# Patient Record
Sex: Male | Born: 1965 | Race: Black or African American | Hispanic: No | Marital: Married | Smoking: Never smoker
Health system: Southern US, Community
[De-identification: ages and names within clinical notes are randomized; demographics above are authoritative.]

## PROBLEM LIST (undated history)

## (undated) DIAGNOSIS — N200 Calculus of kidney: Secondary | ICD-10-CM

## (undated) DIAGNOSIS — I10 Essential (primary) hypertension: Secondary | ICD-10-CM

## (undated) DIAGNOSIS — R0789 Other chest pain: Secondary | ICD-10-CM

## (undated) HISTORY — PX: KIDNEY STONE SURGERY: SHX686

## (undated) HISTORY — DX: Essential (primary) hypertension: I10

## (undated) HISTORY — DX: Calculus of kidney: N20.0

## (undated) HISTORY — DX: Other chest pain: R07.89

---

## 2007-07-02 ENCOUNTER — Encounter: Admission: RE | Admit: 2007-07-02 | Discharge: 2007-07-02 | Payer: Self-pay | Admitting: Family Medicine

## 2008-09-20 ENCOUNTER — Encounter: Admission: RE | Admit: 2008-09-20 | Discharge: 2008-09-20 | Payer: Self-pay | Admitting: Family Medicine

## 2008-10-24 ENCOUNTER — Ambulatory Visit (HOSPITAL_COMMUNITY): Admission: RE | Admit: 2008-10-24 | Discharge: 2008-10-24 | Payer: Self-pay | Admitting: Otolaryngology

## 2008-10-24 ENCOUNTER — Encounter (INDEPENDENT_AMBULATORY_CARE_PROVIDER_SITE_OTHER): Payer: Self-pay | Admitting: Otolaryngology

## 2009-11-07 ENCOUNTER — Encounter: Admission: RE | Admit: 2009-11-07 | Discharge: 2009-11-07 | Payer: Self-pay | Admitting: Family Medicine

## 2010-04-17 ENCOUNTER — Ambulatory Visit: Payer: Self-pay | Admitting: Cardiology

## 2010-04-25 ENCOUNTER — Telehealth (INDEPENDENT_AMBULATORY_CARE_PROVIDER_SITE_OTHER): Payer: Self-pay | Admitting: Radiology

## 2010-04-26 ENCOUNTER — Encounter: Payer: Self-pay | Admitting: Cardiology

## 2010-04-26 ENCOUNTER — Ambulatory Visit: Payer: Self-pay

## 2010-04-26 ENCOUNTER — Encounter (HOSPITAL_COMMUNITY)
Admission: RE | Admit: 2010-04-26 | Discharge: 2010-05-11 | Payer: Self-pay | Source: Home / Self Care | Attending: Cardiology | Admitting: Cardiology

## 2010-04-26 ENCOUNTER — Ambulatory Visit: Payer: Self-pay | Admitting: Cardiology

## 2010-06-20 ENCOUNTER — Ambulatory Visit: Payer: Self-pay | Admitting: Cardiology

## 2010-08-14 NOTE — Assessment & Plan Note (Signed)
Summary: Cardiology Nuclear Testing  Nuclear Med Background Indications for Stress Test: Evaluation for Ischemia   History: Echo  History Comments: '08 Stress Echo:normal  Symptoms: Chest Tightness, Fatigue  Symptoms Comments: Last episode of CP:This a.m., 3/10.   Nuclear Pre-Procedure Cardiac Risk Factors: Family History - CAD, Hypertension, Obesity Caffeine/Decaff Intake: None NPO After: 1:00 AM Lungs: Clear.  O2 Sat 97%. IV 0.9% NS with Angio Cath: 22g     IV Site: R Hand IV Started by: Bonnita Levan, RN Chest Size (in) 46     Height (in): 70 Weight (lb): 235 BMI: 33.84 Tech Comments: All meds. taken today.  Nuclear Med Study 1 or 2 day study:  1 day     Stress Test Type:  Eugenie Birks Reading MD:  Olga Millers, MD     Referring MD:  Peter Swazil[place], MD Resting Radionuclide:  Technetium 1m Tetrofosmin     Resting Radionuclide Dose:  11 mCi  Stress Radionuclide:  Technetium 26m Tetrofosmin     Stress Radionuclide Dose:  33 mCi   Stress Protocol   Lexiscan: 0.4 mg   Stress Test Technologist:  [employer]a [employer], CMA-N     Nuclear Technologist:  Doyne Keel, CNMT  Rest Procedure  Myocardial perfusion imaging was performed at rest 45 minutes following the intravenous administration of Technetium 66m Tetrofosmin.  Stress Procedure  Patient was initially scheduled to walk the treadmill utilizing the Bruce protocol, but had to be changed to lexiscan due to baseline hypertensive response, 140/111.  The patient received IV Lexiscan 0.4 mg over 15-seconds.  Technetium 7m Tetrofosmin injected at 30-seconds.  There were no significant changes with infusion.  He did c/o chest pressure with infusion.  Quantitative spect images were obtained after a 45 minute delay.  QPS Raw Data Images:  There is no interference from other nuclear activity. Stress Images:  There is decreased uptake in the inferior wall and apex. Rest Images:  There is decreased uptake in the inferior wall and apex (slight  reversibility at the apex felt to most likely be not clinically significant). Subtraction (SDS):  No evidence of ischemia. Transient Ischemic Dilatation:  .97  (Normal <1.22)  Lung/Heart Ratio:  .29  (Normal <0.45)  Quantitative Gated Spect Images QGS EDV:  139 ml QGS ESV:  66 ml QGS EF:  53 % QGS cine images:  Normal wall motion.   Overall Impression  Exercise Capacity: Lexiscan with no exercise. BP Response: Normal blood pressure response. Clinical Symptoms: Mild chest pain/dyspnea. ECG Impression: No significant ST segment change suggestive of ischemia. Overall Impression: Abnormal lexiscan nuclear study with inferior and apical thinning vs prior infarct; no ischemia.  Appended Document: Cardiology Nuclear Testing copy sent to DR. Swaziland  Appended Document: Cardiology Nuclear Testing copy sent to Dr. Swaziland

## 2010-08-14 NOTE — Progress Notes (Signed)
Summary: NUC PRE-PROCEDURE  Phone Note Outgoing Call   Call placed by: Domenic Polite, CNMT,  April 25, 2010 3:05 PM Call placed to: Patient Reason for Call: Confirm/change Appt Summary of Call: Reviewed information on My[employer] Information Sheet (see scanned document for further details).  Spoke with patient.  Initial call taken by: Domenic Polite, CNMT,  April 25, 2010 3:05 PM     Nuclear Med Background Indications for Stress Test: Evaluation for Ischemia   History: Echo  History Comments: 2/08 STRESS ECHO NML  Symptoms: Chest Pain    Nuclear Pre-Procedure Cardiac Risk Factors: Family History - CAD, Hypertension

## 2010-09-25 ENCOUNTER — Emergency Department (HOSPITAL_BASED_OUTPATIENT_CLINIC_OR_DEPARTMENT_OTHER)
Admission: EM | Admit: 2010-09-25 | Discharge: 2010-09-26 | Disposition: A | Discharge status: Home / Self Care | Payer: No Typology Code available for payment source | Attending: Emergency Medicine | Admitting: Emergency Medicine

## 2010-09-25 DIAGNOSIS — Y92410 Unspecified street and highway as the place of occurrence of the external cause: Secondary | ICD-10-CM | POA: Insufficient documentation

## 2010-09-25 DIAGNOSIS — M62838 Other muscle spasm: Secondary | ICD-10-CM | POA: Insufficient documentation

## 2010-10-24 LAB — CBC
HCT: 40.6 % (ref 39.0–52.0)
Hemoglobin: 13.5 g/dL (ref 13.0–17.0)
MCHC: 33.4 g/dL (ref 30.0–36.0)
RBC: 4.53 MIL/uL (ref 4.22–5.81)
WBC: 8.3 10*3/uL (ref 4.0–10.5)

## 2010-10-24 LAB — BASIC METABOLIC PANEL
BUN: 9 mg/dL (ref 6–23)
Calcium: 9.7 mg/dL (ref 8.4–10.5)
Creatinine, Ser: 1.06 mg/dL (ref 0.4–1.5)
GFR calc Af Amer: >60 mL/min (ref >60)
GFR calc non Af Amer: >60 mL/min (ref >60)
Glucose, Bld: 103 mg/dL — ABNORMAL HIGH (ref 70–99)
Sodium: 141 mEq/L (ref 135–145)

## 2010-11-27 NOTE — Op Note (Signed)
Justin Glass, Justin Glass               ACCOUNT NO.:  [id number]   MEDICAL RECORD NO.:  [id number]          PATIENT TYPE:  AMB   LOCATION:  SDS                          FACILITY:  [facility]   PHYSICIAN:  Antony Contras, MD     DATE OF BIRTH:  10/30/1965   DATE OF PROCEDURE:  10/24/2008  DATE OF DISCHARGE:  10/24/2008                               OPERATIVE REPORT   PREOPERATIVE DIAGNOSES:  1. Hoarseness.  2. Glottic mass.   POSTOPERATIVE DIAGNOSES:  1. Hoarseness.  2. Glottic mass.   PROCEDURE:  Suspended micro direct laryngoscopy with CO2 laser excision  of left vocal fold polyp.   SURGEON:  Excell Seltzer. Jenne Pane, MD   ANESTHESIA:  General endotracheal anesthesia.   COMPLICATIONS:  None.   INDICATIONS:  The patient is a 45 year old African American male, who  has an 24-month history of increasing hoarseness.  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  He also has excessive throat  clearing.  The hoarseness was affecting his ability to communicate at  work and also affecting his singing.  Fiberoptic examination  demonstrated a red round mass between the vocal fold in the glottis.  He  presents to the operating room for surgical management.   FINDINGS:  Upon laryngoscopy, a round polyp was seen pedicled on the  left anterior vocal fold along the striking margin and mass extending  just deep to the mucosa.  The lesion was excised and sent for Pathology.   DESCRIPTION OF PROCEDURE:  The patient was identified in the holding  room and informed consent having been obtained including discussion of  risks, benefits, and alternatives, the patient was brought to the  operative suite and put on the operative table in the supine position.  Anesthesia was induced and the patient was intubated by the anesthesia  team without difficulty using a small laser safe tube.  The patient was  given intravenous antibiotics and steroids during the case.  The eyes  taped closed and bed was turned 90  degrees from anesthesia.  A tooth  guard was placed over the upper teeth and the larynx was exposed using a  large Storz laryngoscope.  The laryngoscope was placed in suspension on  the Mayo stand using a Lewy arm.  Damp eye pads were placed over the  eyes and a damp towel over the face.  Epinephrine-soaked pledgets were  then held against the lesion for several minutes and then removed.  The  CO2 laser on a setting of 4 W was then used to make an incision along  the lateral margin of the polyp on a setting of 4 W continuous.  Upbiting scissors were then used to connect the spots of the laser and  to dissect the polyp off the underlying tissues..  The scissors were  then used to make an incision along the inferior edge of the polyp  removing it.  The lesion was passed to Nursing for pathology.  Bleeding  was then controlled with an epinephrine-soaked pledget.  The airway was  suctioned.  Lidocaine 2% was then sprayed onto the  larynx.  The  laryngoscope was then taken out of suspension and removed from the  patient's mouth suctioning the airway.  The tooth guard was removed, and  the patient was returned to Anesthesia for wake up.  He was extubated  and moved to recovery room in stable condition.      Antony Contras, MD  Electronically Signed     DDB/MEDQ  D:  10/24/2008  T:  10/24/2008  Job:  [id number]

## 2010-12-28 ENCOUNTER — Encounter: Payer: Self-pay | Admitting: Cardiology

## 2011-01-03 ENCOUNTER — Ambulatory Visit: Payer: No Typology Code available for payment source | Admitting: Cardiology

## 2021-04-26 ENCOUNTER — Emergency Department (HOSPITAL_BASED_OUTPATIENT_CLINIC_OR_DEPARTMENT_OTHER): Payer: No Typology Code available for payment source

## 2021-04-26 ENCOUNTER — Emergency Department (HOSPITAL_BASED_OUTPATIENT_CLINIC_OR_DEPARTMENT_OTHER)
Admission: EM | Admit: 2021-04-26 | Discharge: 2021-04-26 | Disposition: A | Discharge status: Home / Self Care | Payer: No Typology Code available for payment source | Attending: Emergency Medicine | Admitting: Emergency Medicine

## 2021-04-26 ENCOUNTER — Encounter (HOSPITAL_BASED_OUTPATIENT_CLINIC_OR_DEPARTMENT_OTHER): Payer: Self-pay | Admitting: Obstetrics and Gynecology

## 2021-04-26 ENCOUNTER — Other Ambulatory Visit: Payer: Self-pay

## 2021-04-26 DIAGNOSIS — Z7982 Long term (current) use of aspirin: Secondary | ICD-10-CM | POA: Diagnosis not present

## 2021-04-26 DIAGNOSIS — S86912A Strain of unspecified muscle(s) and tendon(s) at lower leg level, left leg, initial encounter: Secondary | ICD-10-CM

## 2021-04-26 DIAGNOSIS — M542 Cervicalgia: Secondary | ICD-10-CM | POA: Diagnosis not present

## 2021-04-26 DIAGNOSIS — I10 Essential (primary) hypertension: Secondary | ICD-10-CM | POA: Insufficient documentation

## 2021-04-26 DIAGNOSIS — Y92410 Unspecified street and highway as the place of occurrence of the external cause: Secondary | ICD-10-CM | POA: Diagnosis not present

## 2021-04-26 DIAGNOSIS — S8392XA Sprain of unspecified site of left knee, initial encounter: Secondary | ICD-10-CM | POA: Diagnosis not present

## 2021-04-26 DIAGNOSIS — Z79899 Other long term (current) drug therapy: Secondary | ICD-10-CM | POA: Insufficient documentation

## 2021-04-26 DIAGNOSIS — S8992XA Unspecified injury of left lower leg, initial encounter: Secondary | ICD-10-CM | POA: Diagnosis present

## 2021-04-26 MED ORDER — METHOCARBAMOL 500 MG PO TABS: 500.0000 mg | ORAL_TABLET | Freq: Three times a day (TID) | ORAL | 0 refills | Status: AC | PRN
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
## 2021-04-26 NOTE — Discharge Instructions (Signed)
Take Tylenol, Motrin as needed for pain control.  Can also take the prescribed muscle relaxer.  Note this can make you slightly drowsy should not be taken while driving or operating heavy machinery.

## 2021-07-13 ENCOUNTER — Other Ambulatory Visit: Payer: Self-pay

## 2021-07-13 ENCOUNTER — Encounter (HOSPITAL_BASED_OUTPATIENT_CLINIC_OR_DEPARTMENT_OTHER): Payer: Self-pay | Admitting: Emergency Medicine

## 2021-07-13 DIAGNOSIS — I10 Essential (primary) hypertension: Secondary | ICD-10-CM | POA: Insufficient documentation

## 2021-07-13 DIAGNOSIS — Z79899 Other long term (current) drug therapy: Secondary | ICD-10-CM | POA: Diagnosis not present

## 2021-07-13 DIAGNOSIS — N200 Calculus of kidney: Secondary | ICD-10-CM | POA: Insufficient documentation

## 2021-07-13 DIAGNOSIS — M549 Dorsalgia, unspecified: Secondary | ICD-10-CM | POA: Insufficient documentation

## 2021-07-13 LAB — URINALYSIS, ROUTINE W REFLEX MICROSCOPIC
Bilirubin Urine: NEGATIVE
Glucose, UA: NEGATIVE mg/dL
Hgb urine dipstick: NEGATIVE
Ketones, ur: NEGATIVE mg/dL
Leukocytes,Ua: NEGATIVE
Nitrite: NEGATIVE
Protein, ur: NEGATIVE mg/dL
Specific Gravity, Urine: 1.015 (ref 1.005–1.030)
pH: 6.5 (ref 5.0–8.0)

## 2021-07-13 LAB — CBC
HCT: 41.4 % (ref 39.0–52.0)
Hemoglobin: 13.3 g/dL (ref 13.0–17.0)
MCH: 29.3 pg (ref 26.0–34.0)
MCHC: 32.1 g/dL (ref 30.0–36.0)
MCV: 91.2 fL (ref 80.0–100.0)
Platelets: 319 10*3/uL (ref 150–400)
RBC: 4.54 MIL/uL (ref 4.22–5.81)
RDW: 14.5 % (ref 11.5–15.5)
WBC: 12 10*3/uL — ABNORMAL HIGH (ref 4.0–10.5)
nRBC: 0 % (ref 0.0–0.2)

## 2021-07-13 LAB — COMPREHENSIVE METABOLIC PANEL
ALT: 15 U/L (ref 0–44)
AST: 14 U/L — ABNORMAL LOW (ref 15–41)
Albumin: 4.3 g/dL (ref 3.5–5.0)
Alkaline Phosphatase: 79 U/L (ref 38–126)
Anion gap: 10 (ref 5–15)
BUN: 9 mg/dL (ref 6–20)
CO2: 26 mmol/L (ref 22–32)
Calcium: 9.4 mg/dL (ref 8.9–10.3)
Chloride: 105 mmol/L (ref 98–111)
Creatinine, Ser: 0.97 mg/dL (ref 0.61–1.24)
GFR, Estimated: >60 mL/min (ref >60)
Glucose, Bld: 87 mg/dL (ref 70–99)
Potassium: 3.4 mmol/L — ABNORMAL LOW (ref 3.5–5.1)
Sodium: 141 mmol/L (ref 135–145)
Total Bilirubin: 0.5 mg/dL (ref 0.3–1.2)
Total Protein: 7.6 g/dL (ref 6.5–8.1)

## 2021-07-13 LAB — LIPASE, BLOOD: Lipase: 151 U/L — ABNORMAL HIGH (ref 11–51)

## 2021-07-13 NOTE — ED Triage Notes (Signed)
Pt arrives pov, ambulatory with c/o left flank pain x 3 days  Pt denies dysuria or fever. Pt also reports LLQ pain

## 2021-07-14 ENCOUNTER — Emergency Department (HOSPITAL_BASED_OUTPATIENT_CLINIC_OR_DEPARTMENT_OTHER)
Admission: EM | Admit: 2021-07-14 | Discharge: 2021-07-14 | Disposition: A | Discharge status: Home / Self Care | Payer: No Typology Code available for payment source | Attending: Emergency Medicine | Admitting: Emergency Medicine

## 2021-07-14 ENCOUNTER — Emergency Department (HOSPITAL_BASED_OUTPATIENT_CLINIC_OR_DEPARTMENT_OTHER): Payer: No Typology Code available for payment source

## 2021-07-14 DIAGNOSIS — M549 Dorsalgia, unspecified: Secondary | ICD-10-CM

## 2021-07-14 MED ORDER — IOHEXOL 300 MG/ML  SOLN
100.0000 mL | Freq: Once | INTRAMUSCULAR | Status: AC | PRN
  Administered 2021-07-14: 100 mL via INTRAVENOUS

## 2021-07-14 NOTE — ED Provider Notes (Signed)
[facility] EMERGENCY DEPT Provider Note   CSN: [id number] Arrival date & time: 07/13/21  1616     History No chief complaint on file.   Justin Glass is a 55 y.o. male.  55 yo M who has had 3 days of back pain and now with some pain in abdomen left side worse with coughing and deep breathing. H/o kidney stones, but no urinary changes. Tried cranberry juice. No trauma or inciting incidence. No nausea.        Past Medical History:  Diagnosis Date   Chest pain, atypical    Hypertension    Renal calculi     There are no problems to display for this patient.   Past Surgical History:  Procedure Laterality Date   KIDNEY STONE SURGERY         Family History  Problem Relation Age of Onset   Hypertension Mother    Hypertension Sister    Hypertension Brother     Social History   Tobacco Use   Smoking status: Never    Passive exposure: Never   Smokeless tobacco: Never  Vaping Use   Vaping Use: Never used  Substance Use Topics   Alcohol use: No   Drug use: No    Home Medications Prior to Admission medications   Medication Sig Start Date End Date Taking? Authorizing Provider  amLODipine-valsartan (EXFORGE) 10-320 MG per tablet Take 1 tablet by mouth daily.      [provider]  aspirin 81 MG tablet Take 81 mg by mouth as needed.      [provider]  methocarbamol (ROBAXIN) 500 MG tablet Take 1 tablet (500 mg total) by mouth every 8 (eight) hours as needed for muscle spasms. 04/26/21   Milagros Loll, MD    Allergies    Patient has no known allergies.  Review of Systems   Review of Systems  All other systems reviewed and are negative.  Physical Exam Updated Vital Signs BP (!) 164/103 (BP Location: Right Arm)    Pulse 68    Temp 97.8 F (36.6 C)    Resp 20    Ht 5\' 10"  (1.778 m)    Wt 117.9 kg    SpO2 97%    BMI 37.31 kg/m   Physical Exam Vitals and nursing note reviewed.  Constitutional:      Appearance: He is  well-developed.  HENT:     Head: Normocephalic and atraumatic.     Mouth/Throat:     Mouth: Mucous membranes are moist.     Pharynx: Oropharynx is clear.  Eyes:     Pupils: Pupils are equal, round, and reactive to light.  Cardiovascular:     Rate and Rhythm: Normal rate.  Pulmonary:     Effort: Pulmonary effort is normal. No respiratory distress.  Abdominal:     General: Abdomen is flat. There is no distension.  Musculoskeletal:        General: Normal range of motion.     Cervical back: Normal range of motion.  Skin:    General: Skin is warm and dry.  Neurological:     General: No focal deficit present.     Mental Status: He is alert.    ED Results / Procedures / Treatments   Labs (all labs ordered are listed, but only abnormal results are displayed) Labs Reviewed  LIPASE, BLOOD - Abnormal; Notable for the following components:      Result Value   Lipase 151 (*)  All other components within normal limits  COMPREHENSIVE METABOLIC PANEL - Abnormal; Notable for the following components:   Potassium 3.4 (*)    AST 14 (*)    All other components within normal limits  CBC - Abnormal; Notable for the following components:   WBC 12.0 (*)    All other components within normal limits  URINALYSIS, ROUTINE W REFLEX MICROSCOPIC    EKG None  Radiology No results found.  Procedures Procedures   Medications Ordered in ED Medications  iohexol (OMNIPAQUE) 300 MG/ML solution 100 mL (100 mLs Intravenous Contrast Given 07/14/21 0027)    ED Course  I have reviewed the triage vital signs and the nursing notes.  Pertinent labs & imaging results that were available during my care of the patient were reviewed by me and considered in my medical decision making (see chart for details).  Clinical Course as of 07/14/21 2253  Sat Jul 14, 2021  0048 Lipase(!): 151 Mildly elevated, will ct for pancreatitis. Also consider kidney stone with the lower back and abdominal pain reports.    [JM]  0048 Potassium(!): 3.4 Noted.  [JM]  0048 WBC(!): 12.0 Ct pending.  [JM]  0049 BP(!): 171/106 [JM]  0049 BP(!): 164/103 History of same.  [JM]  0210 Hgb urine dipstick: NEGATIVE Unclear etiology for symptoms.  Has history of kidney stones and was worried that that was the cause however none seen here and no other secondary evidence of the same.  His lipase is slightly elevated but is not having vomiting, epigastric pain that radiates to his back, CT scan findings consistent with pancreatitis I do not think that is the case.  Rest of his work-up is unremarkable as noted above.  Patient is reassured and will come back with any change or worsening of symptoms.  [facility] [JM]    Clinical Course User Index [JM] Oanh Devivo, Barbara Cower, MD   MDM Rules/Calculators/A&P                           Final Clinical Impression(s) / ED Diagnoses Final diagnoses:  Left-sided back pain, unspecified back location, unspecified chronicity    Rx / DC Orders ED Discharge Orders     None        Marcellous Snarski, Barbara Cower, MD 07/14/21 2253

## 2021-07-14 NOTE — ED Notes (Signed)
Transported to CT 

## 2023-02-18 IMAGING — CT CT ABD-PELV W/ CM
2 of 5 series · 16 of 46 positions shown, 18 images · IV contrast (APPLIED)
Comparison: None available.

CLINICAL DATA: Initial evaluation for acute left lower quadrant
abdominal pain.

EXAM:
CT ABDOMEN AND PELVIS WITH CONTRAST
TECHNIQUE: Multidetector CT imaging of the abdomen and pelvis was performed
using the standard protocol following bolus administration of
intravenous contrast.
CONTRAST:  100mL OMNIPAQUE IOHEXOL 300 MG/ML  SOLN

[Series 2: abd pel w · axial · 0.86mm/px · z∈[+786,+1216]mm · 13 of 98 slices shown, 15 images]
[im 6/98  soft-tissue]
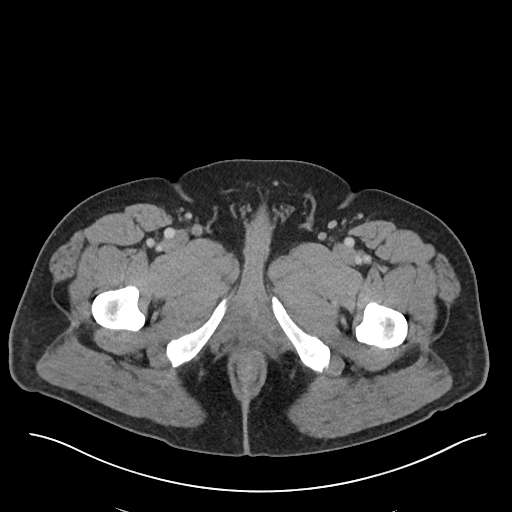
[im 6/98  bone]
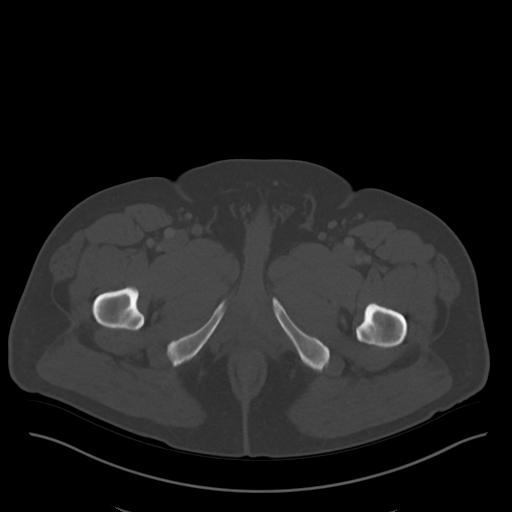
[im 11/98  soft-tissue]
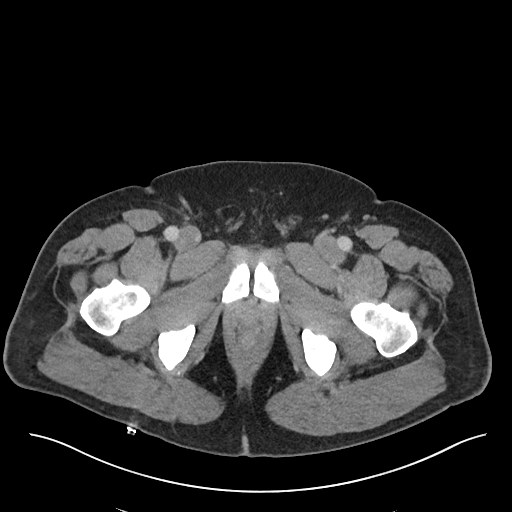
[im 22/98  soft-tissue]
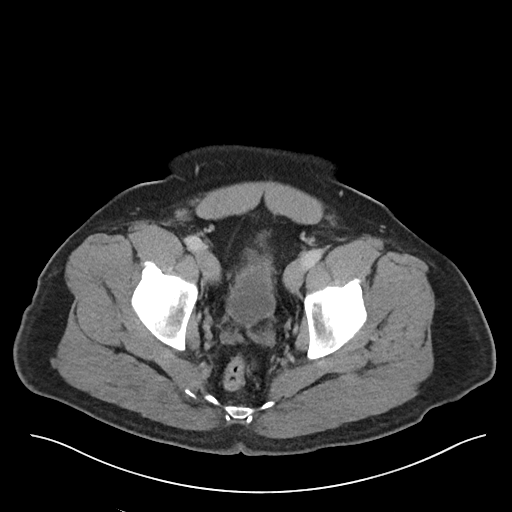
[im 27/98  soft-tissue]
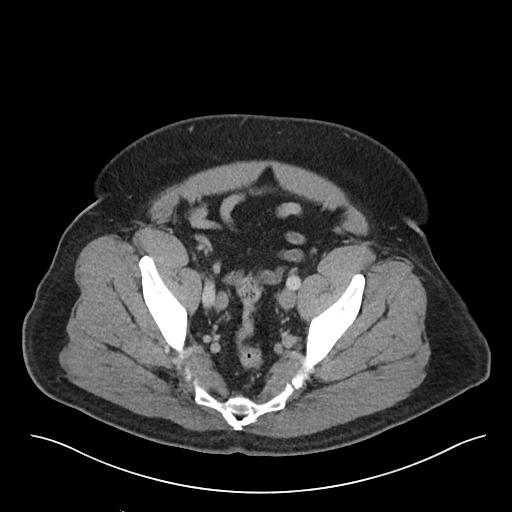
[im 33/98  soft-tissue]
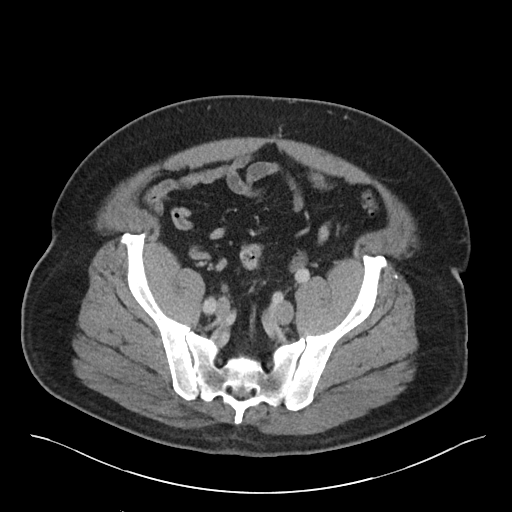
[im 44/98  soft-tissue]
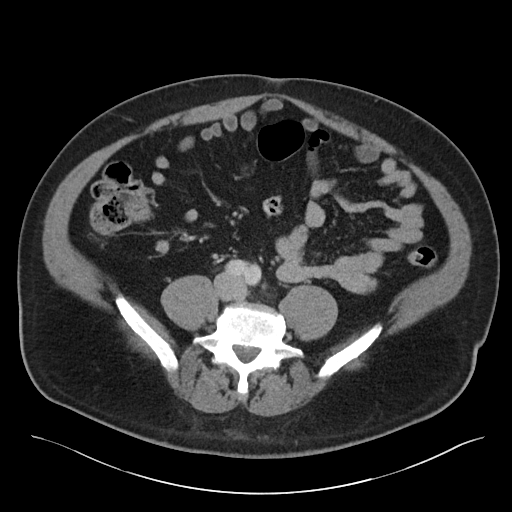
[im 49/98  soft-tissue]
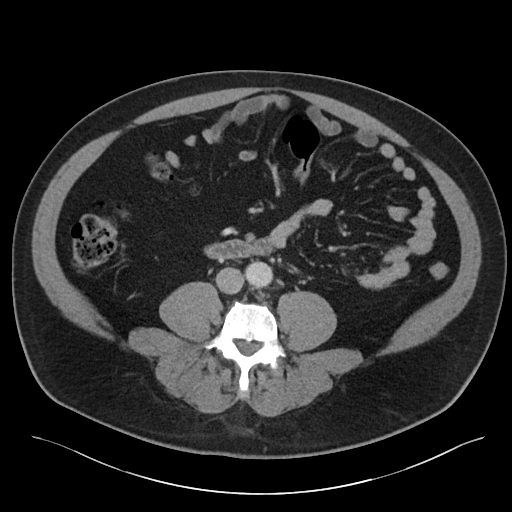
[im 54/98  soft-tissue]
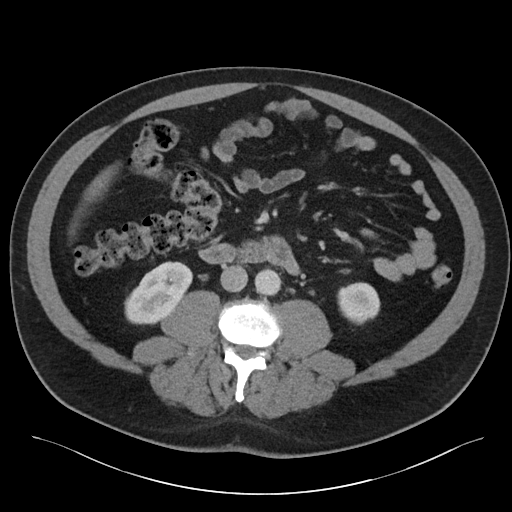
[im 65/98  soft-tissue]
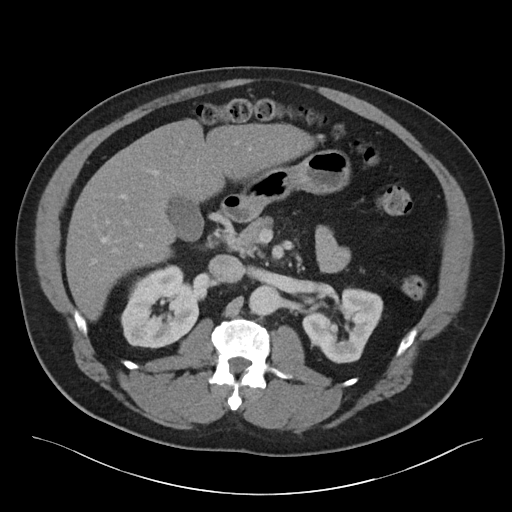
[im 65/98  bone]
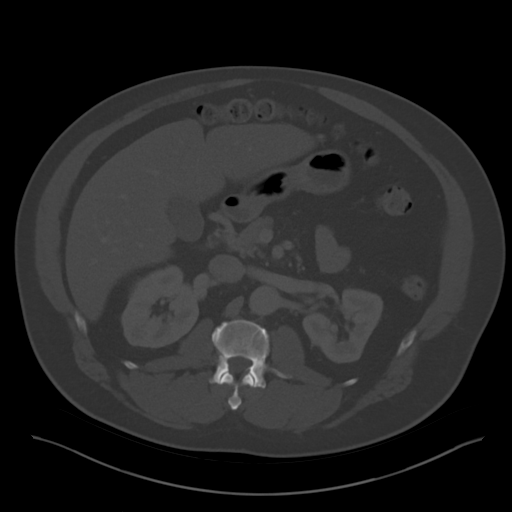
[im 71/98  soft-tissue]
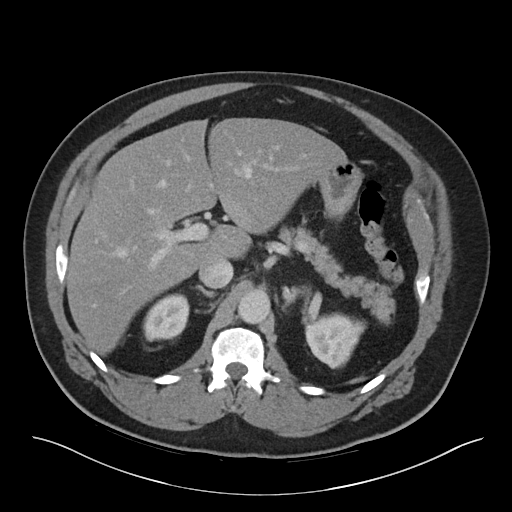
[im 76/98  soft-tissue]
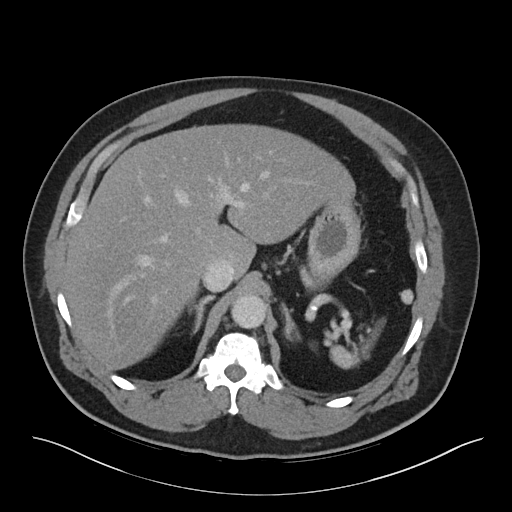
[im 87/98  soft-tissue]
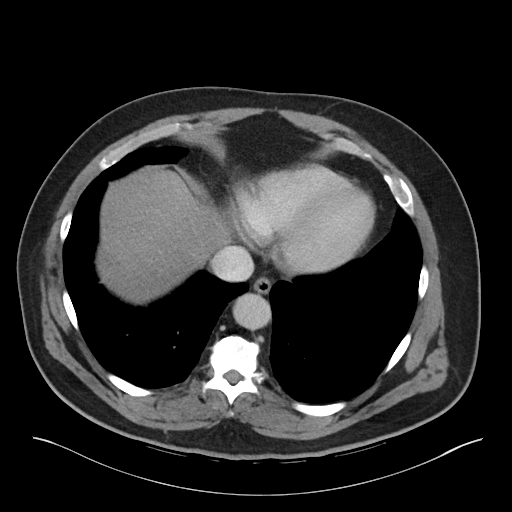
[im 92/98  soft-tissue]
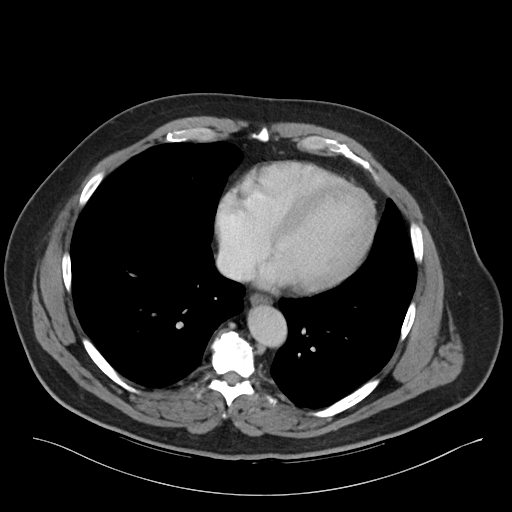

[Series 5: coronal · coronal · 0.88mm/px · 3 of 123 slices shown]
[im 41/123  soft-tissue]
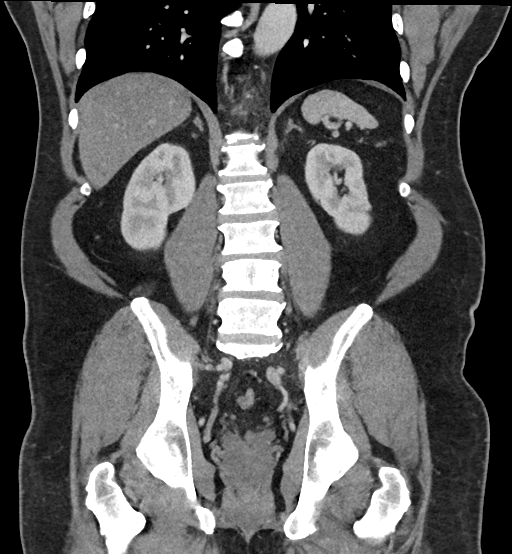
[im 55/123  soft-tissue]
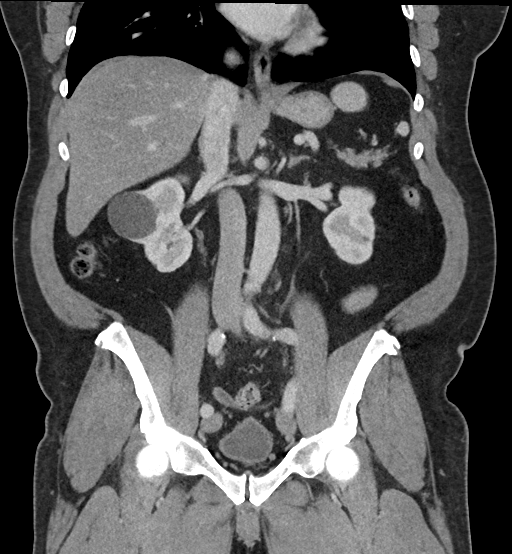
[im 68/123  soft-tissue]
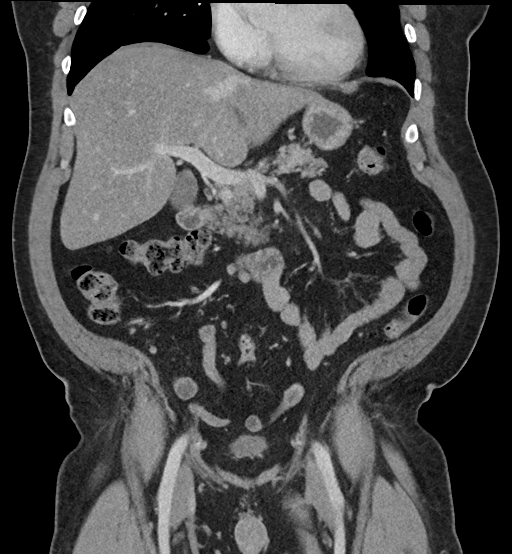

[16 of 46 positions shown; findings below may reference images not displayed]

FINDINGS: Lower chest: Visualized lung bases are clear.

Hepatobiliary: Diffuse hypoattenuation liver, suggesting steatosis.
Subcentimeter hypodensity at the posterior right hepatic lobe too
small the characterize (series 2, image 30). Liver otherwise
unremarkable. Gallbladder within normal limits. No biliary
dilatation.

Pancreas: Unremarkable. No pancreatic ductal dilatation or
surrounding inflammatory changes.

Spleen: Spleen demonstrates a somewhat lobulated and irregular
contour or. Superimposed 1.8 cm cyst at the upper aspect of the
spleen noted (series 2, image 15).

Adrenals/Urinary Tract: Adrenal glands within normal limits. Kidneys
equal in size with symmetric enhancement. 5 cm simple cyst present
at the interpolar right kidney. Additional subcentimeter hypodensity
at the upper pole the right kidney too small the characterize, but
statistically likely reflects a small cyst as well. No enhancing
renal mass. Few scattered nonobstructive calculi present within the
left kidney, largest of which measures 8 mm at the interpolar
region. Punctate nonobstructive calculus present at the lower pole
of the right kidney. No hydronephrosis. No obstructive calculi seen
along the course of either renal collecting system. No hydroureter.
Bladder largely decompressed without acute finding.

Stomach/Bowel: Stomach within normal limits. No evidence for bowel
obstruction. Normal appendix. Mild colonic diverticulosis without
evidence for acute diverticulitis. No acute inflammatory changes
seen about the bowels.

Vascular/Lymphatic: Normal intravascular enhancement seen throughout
the intra-abdominal aorta. Mesenteric vessels patent proximally. No
adenopathy.

Reproductive: Prostate within normal limits.

Other: No free air or fluid.

Musculoskeletal: No acute osseous finding. No discrete or worrisome
osseous lesions.
IMPRESSION: 1. No CT evidence for acute intra-abdominal or pelvic process.
2. Bilateral nonobstructive nephrolithiasis as above.
3. Mild colonic diverticulosis without evidence for acute
diverticulitis.
4. Hepatic steatosis.
